# Patient Record
Sex: Female | Born: 2000 | Hispanic: No | Marital: Single | Smoking: Never smoker
Health system: Southern US, Community
[De-identification: ages and names within clinical notes are randomized; demographics above are authoritative.]

## PROBLEM LIST (undated history)

## (undated) DIAGNOSIS — J45909 Unspecified asthma, uncomplicated: Secondary | ICD-10-CM

## (undated) DIAGNOSIS — R569 Unspecified convulsions: Principal | ICD-10-CM

## (undated) HISTORY — DX: Unspecified convulsions: R56.9

## (undated) HISTORY — DX: Unspecified asthma, uncomplicated: J45.909

---
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
## 2019-03-01 ENCOUNTER — Telehealth: Payer: Self-pay

## 2019-03-01 NOTE — Telephone Encounter (Signed)
I contacted the pt in regards to her 03/02/19 appt. Pt states [place] out [place] and no longer needed the appt. Appt has been canceled.

## 2019-03-02 ENCOUNTER — Ambulatory Visit: Payer: 59 | Admitting: Neurology

## 2019-04-25 IMAGING — MR MR HEAD WO/W CM
9 of 13 series · 33 of 48 positions shown · IV contrast (gadavist)
Comparison: None.

CLINICAL DATA: 17-year-old female with new onset seizure. Confused.

EXAM:
MRI HEAD WITHOUT AND WITH CONTRAST
TECHNIQUE: Multiplanar, multiecho pulse sequences of the brain and surrounding
structures were obtained without and with intravenous contrast.
CONTRAST:  5.5 milliliters Gadavist

[Series 3: DWI · axial · 3.0mm · 0.94mm/px · z∈[-84,+62]mm · 8 of 100 slices shown (1 of 2)]
[im 1/100]
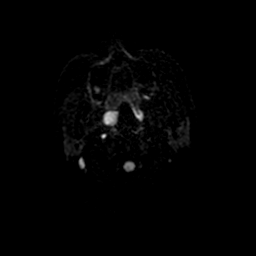
[im 15/100]
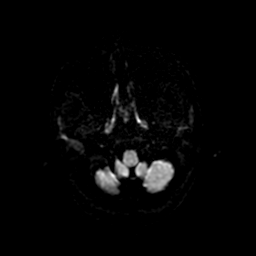
[im 29/100]
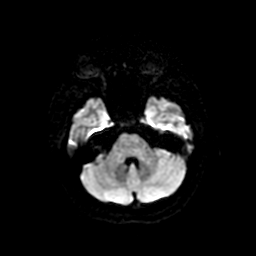
[im 43/100]
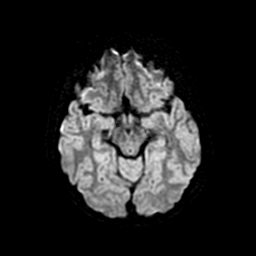
[im 57/100]
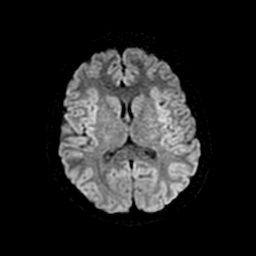
[im 71/100]
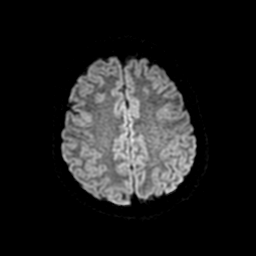
[im 85/100]
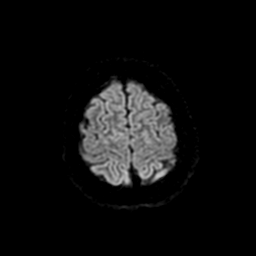
[im 100/100]
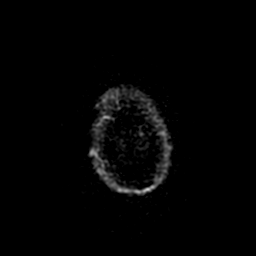

[Series 4: DWI · coronal · 4.0mm · 0.94mm/px · 6 of 72 slices shown (2 of 2)]
[im 1/72]
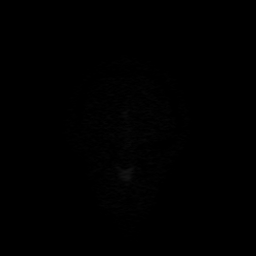
[im 15/72]
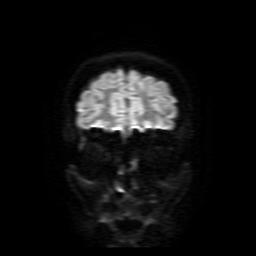
[im 29/72]
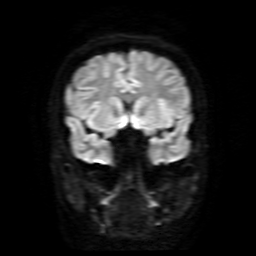
[im 43/72]
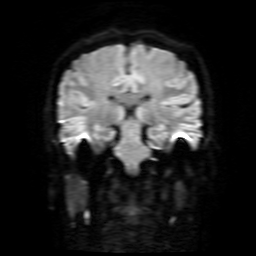
[im 57/72]
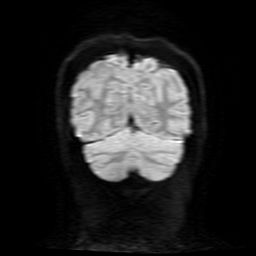
[im 72/72]
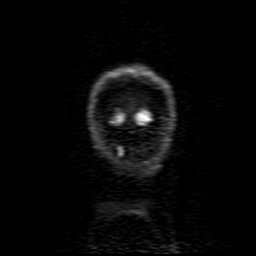

[Series 5: FLAIR · sagittal · 5.0mm · 0.47mm/px · 2 of 23 slices shown (1 of 3)]
[im 1/23]
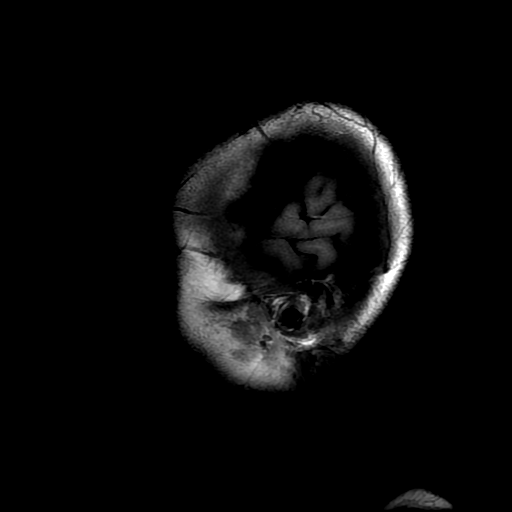
[im 23/23]
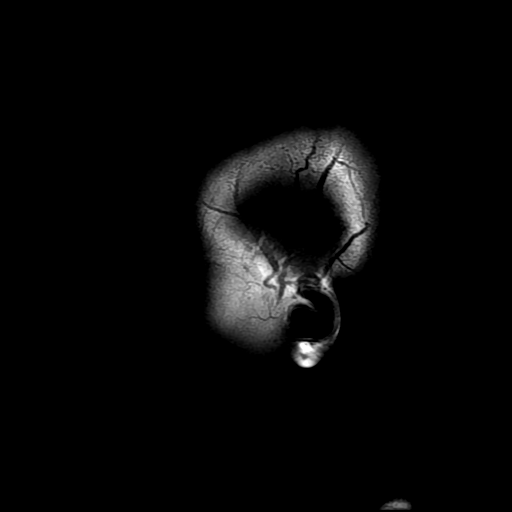

[Series 6: T2 · axial · 5.0mm · 0.47mm/px · z∈[-82,+60]mm · 2 of 25 slices shown (1 of 2)]
[im 1/25]
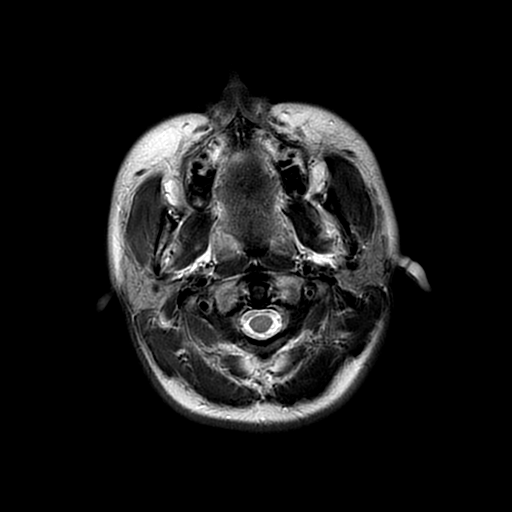
[im 25/25]
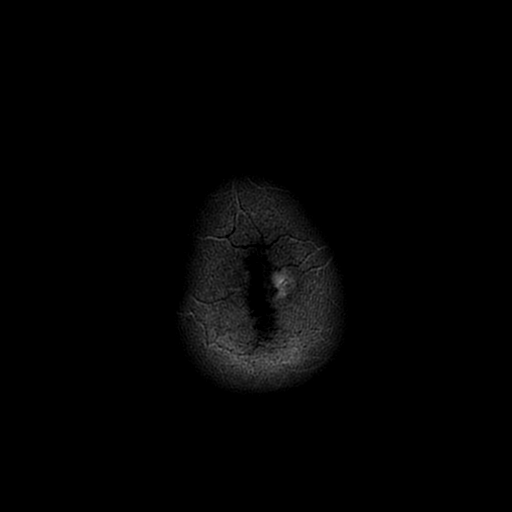

[Series 7: FLAIR · axial · 5.0mm · 0.47mm/px · z∈[-82,+60]mm · 2 of 25 slices shown (2 of 3)]
[im 1/25]
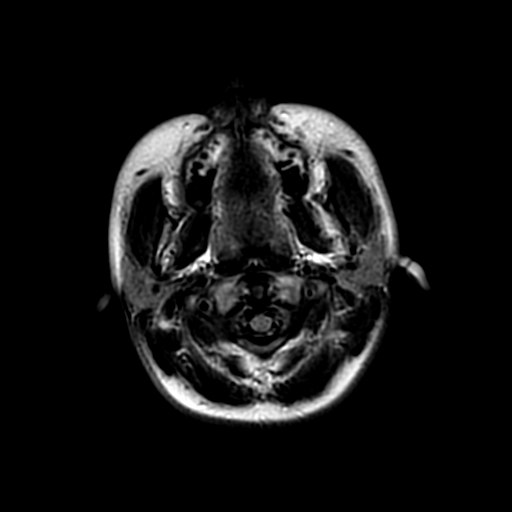
[im 25/25]
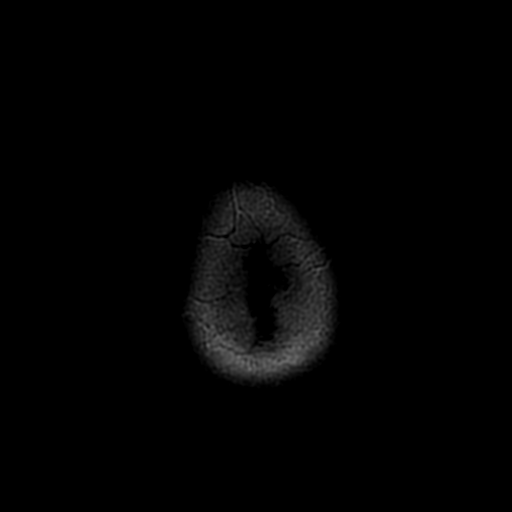

[Series 10: FLAIR · coronal · 3.0mm · 0.35mm/px · 3 of 36 slices shown (3 of 3)]
[im 1/36]
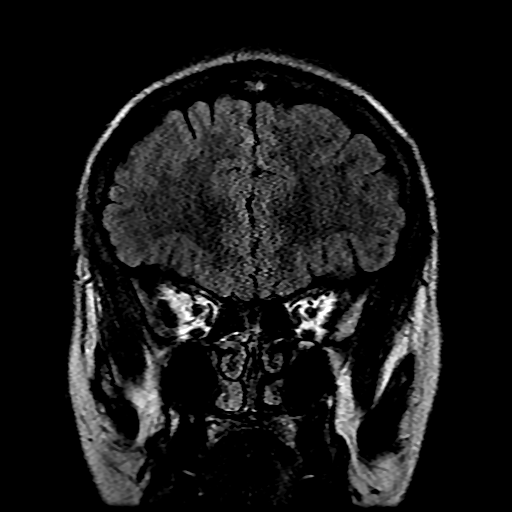
[im 18/36]
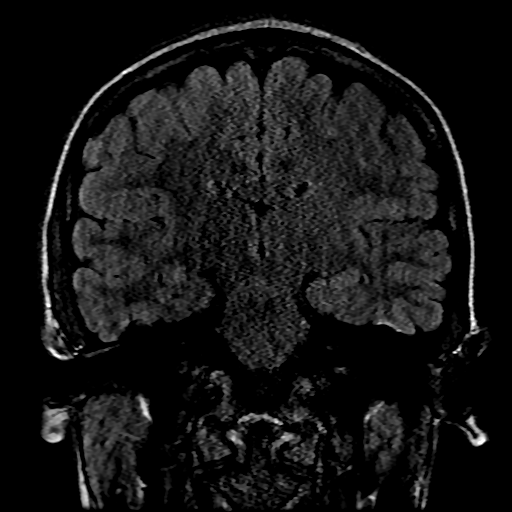
[im 36/36]
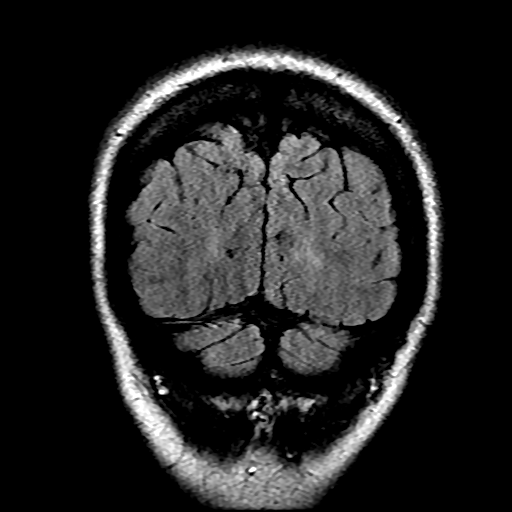

[Series 11: T2 · coronal · 3.0mm · 0.35mm/px · 3 of 36 slices shown (2 of 2)]
[im 1/36]
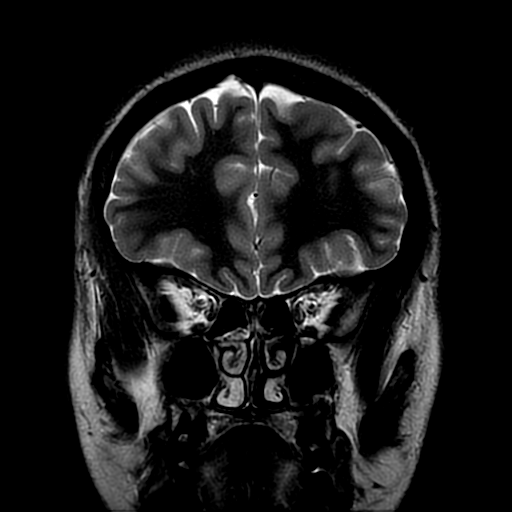
[im 18/36]
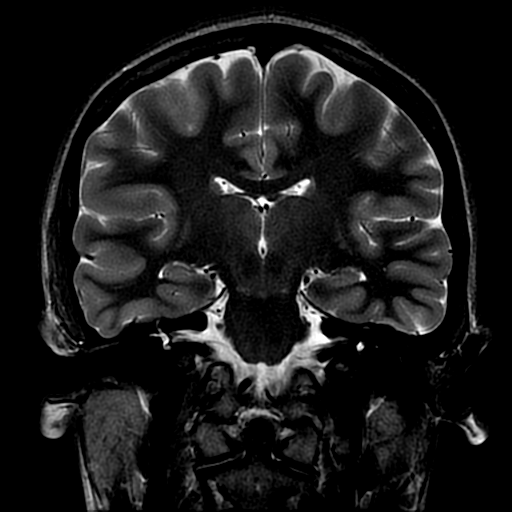
[im 36/36]
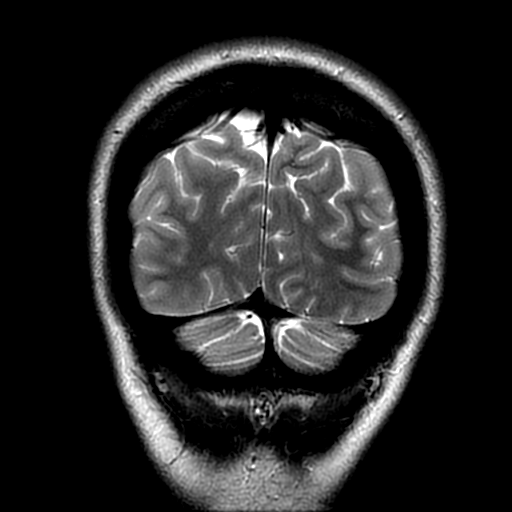

[Series 350: ADC · axial · 3.0mm · 0.94mm/px · z∈[-84,+62]mm · 4 of 50 slices shown (1 of 2)]
[im 1/50]
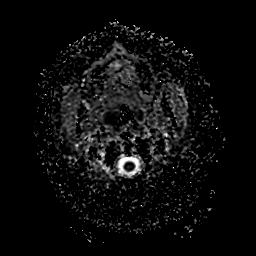
[im 17/50]
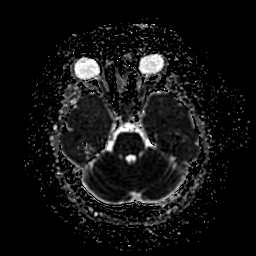
[im 33/50]
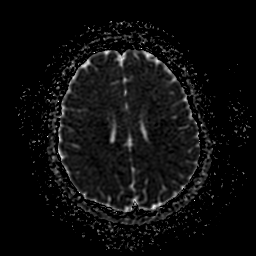
[im 50/50]
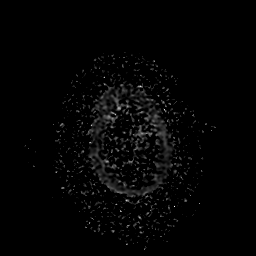

[Series 450: ADC · coronal · 4.0mm · 0.94mm/px · 3 of 36 slices shown (2 of 2)]
[im 1/36]
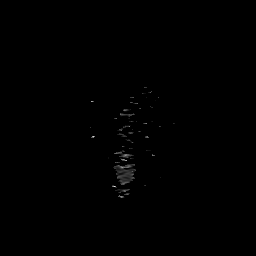
[im 18/36]
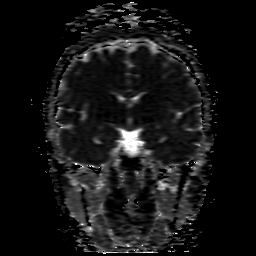
[im 36/36]
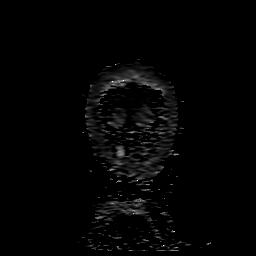

[33 of 48 positions shown; findings below may reference images not displayed]

FINDINGS: Brain: Normal cerebral volume. No restricted diffusion to suggest
acute infarction. No midline shift, mass effect, evidence of mass
lesion, ventriculomegaly, extra-axial collection or acute
intracranial hemorrhage. Cervicomedullary junction and pituitary are
within normal limits.

The hippocampal formations appear symmetric and normal (series 11,
image 20). No mesial temporal lobe signal asymmetry or abnormality.

Gray and white matter signal is within normal limits throughout the
brain. No encephalomalacia or chronic cerebral blood products. No
migrational abnormality. No abnormal enhancement identified. No
dural thickening.

Vascular: Major intracranial vascular flow voids are preserved. The
right vertebral artery appears dominant. The major dural venous
sinuses are enhancing and appear patent.

Skull and upper cervical spine: Normal visible cervical spine.
Visualized bone marrow signal is within normal limits.

Sinuses/Orbits: Normal orbits soft tissues. Paranasal sinuses are
clear.

Other: Mastoid air cells are clear. Visible internal auditory
structures appear normal. Scalp and face soft tissues appear
negative.
IMPRESSION: Normal MRI appearance of the brain.
